# Patient Record
Sex: Male | Born: 2000 | Race: Black or African American | Hispanic: No | Marital: Single | Smoking: Never smoker
Health system: Southern US, Community
[De-identification: ages and names within clinical notes are randomized; demographics above are authoritative.]

## PROBLEM LIST (undated history)

## (undated) ENCOUNTER — Ambulatory Visit

## (undated) DIAGNOSIS — S42009A Fracture of unspecified part of unspecified clavicle, initial encounter for closed fracture: Secondary | ICD-10-CM

## (undated) HISTORY — DX: Fracture of unspecified part of unspecified clavicle, initial encounter for closed fracture: S42.009A

---
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
## 2021-01-01 LAB — HIV ANTIBODY (ROUTINE TESTING W REFLEX): HIV Screen 4th Generation wRfx: NONREACTIVE

## 2021-01-01 LAB — RPR: RPR Ser Ql: NONREACTIVE

## 2021-01-03 ENCOUNTER — Other Ambulatory Visit: Payer: Self-pay | Admitting: Family Medicine

## 2021-01-03 LAB — URINE CYTOLOGY ANCILLARY ONLY
Chlamydia: POSITIVE — AB
Comment: NEGATIVE
Comment: NORMAL
Neisseria Gonorrhea: NEGATIVE

## 2021-01-03 NOTE — Progress Notes (Signed)
I attempted to reach out to Rawlin multiple times today on his cell phone without success.  His number did not go to a voicemail.  As result, I called the other contact listed in his medical record and spoke with his mother who informed me that he is at work at the moment.  It is easier to contact him in the morning.  If he returns a call to the clinic.  Please let him know I wanted to talk about his recent test results as he does have an infection that we need to treat.

## 2021-01-04 ENCOUNTER — Telehealth: Payer: Self-pay

## 2021-01-04 ENCOUNTER — Other Ambulatory Visit: Payer: Self-pay | Admitting: Family Medicine

## 2021-01-04 MED ORDER — DOXYCYCLINE HYCLATE 100 MG PO TABS: 100.0000 mg | ORAL_TABLET | Freq: Two times a day (BID) | ORAL | 0 refills | Status: AC

## 2021-01-04 NOTE — Progress Notes (Unsigned)
Steven Spears was contacted and informed that his STI screening was remarkable for a positive chlamydia test.  He was informed that he should be treated here in clinic with antibiotics.  He was also told that he should abstain from intercourse until he has completed his treatment.  He was also encouraged to provide the names and birthdays of any partners in the past 2 months so that we could also have them treated.  He reported that he has had 1 partner in that timeframe whose first name is [name].  He does not have the birthday or last name of this partner.  -Doxycycline 100 mg 7 days twice daily sent to his pharmacy -He was encouraged to call back with his partners name, birthday and any known drug allergies so we could have e.p.t. sent in for her.

## 2021-01-04 NOTE — Telephone Encounter (Signed)
Patient calls nurse line returning phone call to provider. Per chart review, provider was calling to discuss results.   Patient states that he can be reached at [phone] between now and 3 p.m. to discuss results.   To PCP  Veronda Prude, RN

## 2021-09-07 ENCOUNTER — Other Ambulatory Visit: Payer: Self-pay

## 2021-09-07 ENCOUNTER — Ambulatory Visit
Admission: EM | Admit: 2021-09-07 | Discharge: 2021-09-07 | Disposition: A | Discharge status: Home / Self Care | Payer: Medicaid Other | Attending: Physician Assistant | Admitting: Physician Assistant

## 2021-09-07 ENCOUNTER — Encounter: Payer: Self-pay | Admitting: Emergency Medicine

## 2021-09-07 DIAGNOSIS — J069 Acute upper respiratory infection, unspecified: Secondary | ICD-10-CM | POA: Diagnosis not present

## 2021-09-07 MED ORDER — IBUPROFEN 400 MG PO TABS
400.0000 mg | ORAL_TABLET | Freq: Once | ORAL | Status: AC
  Administered 2021-09-07: 400 mg via ORAL

## 2021-09-07 NOTE — ED Provider Notes (Signed)
[facility]    CSN: [id number] Arrival date & time: 09/07/21  0936      History   Chief Complaint Chief Complaint  Patient presents with   Cough    HPI Steven Spears is a 20 y.o. male.   Patient here today with his mother for evaluation of cough, fever, body aches that started 2 to 3 days ago.  He has not had any vomiting or diarrhea.  He does report some sore throat with cough.  He has tried over-the-counter medication with mild relief of symptoms.  The history is provided by the patient and a parent.  Cough Associated symptoms: chills, fever and sore throat   Associated symptoms: no ear pain, no eye discharge and no shortness of breath    Past Medical History:  Diagnosis Date   Clavicle fracture    20 years old    Patient Active Problem List   Diagnosis Date Noted   Symptoms of depression 08/06/2018   Right knee injury 05/17/2016   Concussion with no loss of consciousness 07/12/2015   Atopic dermatitis 06/05/2014   Seasonal allergies 02/07/2012   Dog bite of limb 05/23/2011   Routine screening for STI (sexually transmitted infection) 12/19/2010    History reviewed. No pertinent surgical history.     Home Medications    Prior to Admission medications   Medication Sig Start Date End Date Taking? Authorizing Provider  acetaminophen (TYLENOL) 325 MG tablet Take 2 tablets (650 mg total) by mouth every 4 (four) hours as needed for headache. 07/05/15   Lowanda Foster, NP  hydrocortisone 2.5 % cream Apply topically daily as needed. Avoid use on face. 06/05/14   Smitty Cords, DO    Family History Family History  Problem Relation Age of Onset   Asthma Mother    Asthma Father    Heart disease Maternal Grandmother     Social History Social History   Tobacco Use   Smoking status: Never   Smokeless tobacco: Never  Substance Use Topics   Alcohol use: No   Drug use: No     Allergies   Patient has no known allergies.   Review of  Systems Review of Systems  Constitutional:  Positive for chills and fever.  HENT:  Positive for congestion and sore throat. Negative for ear pain.   Eyes:  Negative for discharge and redness.  Respiratory:  Positive for cough. Negative for shortness of breath.   Gastrointestinal:  Negative for abdominal pain, diarrhea, nausea and vomiting.    Physical Exam Triage Vital Signs ED Triage Vitals [09/07/21 0956]  Enc Vitals Group     BP 109/68     Pulse Rate 95     Resp 16     Temp (!) 101.3 F (38.5 C)     Temp Source Oral     SpO2 97 %     Weight      Height      Head Circumference      Peak Flow      Pain Score 0     Pain Loc      Pain Edu?      Excl. in GC?    No data found.  Updated Vital Signs BP 109/68 (BP Location: Right Arm)   Pulse 95   Temp (!) 101.3 F (38.5 C) (Oral)   Resp 16   SpO2 97%      Physical Exam Vitals and nursing note reviewed.  Constitutional:  General: He is not in acute distress.    Appearance: Normal appearance. He is not ill-appearing.  HENT:     Head: Normocephalic and atraumatic.     Nose: Congestion present.     Mouth/Throat:     Mouth: Mucous membranes are moist.     Pharynx: Oropharynx is clear. No oropharyngeal exudate or posterior oropharyngeal erythema.  Eyes:     Conjunctiva/sclera: Conjunctivae normal.  Cardiovascular:     Rate and Rhythm: Normal rate and regular rhythm.     Heart sounds: Normal heart sounds. No murmur heard. Pulmonary:     Effort: Pulmonary effort is normal. No respiratory distress.     Breath sounds: Normal breath sounds. No wheezing, rhonchi or rales.  Skin:    General: Skin is warm and dry.  Neurological:     Mental Status: He is alert.  Psychiatric:        Mood and Affect: Mood normal.        Thought Content: Thought content normal.     UC Treatments / Results  Labs (all labs ordered are listed, but only abnormal results are displayed) Labs Reviewed  COVID-19, FLU A+B NAA     EKG   Radiology No results found.  Procedures Procedures (including critical care time)  Medications Ordered in UC Medications  ibuprofen (ADVIL) tablet 400 mg (400 mg Oral Given 09/07/21 1007)    Initial Impression / Assessment and Plan / UC Course  I have reviewed the triage vital signs and the nursing notes.  Pertinent labs & imaging results that were available during my care of the patient were reviewed by me and considered in my medical decision making (see chart for details).    Suspect likely viral etiology of symptoms.  Will order flu and COVID screening.  Recommend symptomatic treatment, increase fluids and rest in the meantime.  Encouraged follow-up with any further concerns.  Final Clinical Impressions(s) / UC Diagnoses   Final diagnoses:  Acute upper respiratory infection   Discharge Instructions   None    ED Prescriptions   None    PDMP not reviewed this encounter.   Tomi Bamberger, PA-C 09/07/21 1112

## 2021-09-07 NOTE — ED Triage Notes (Signed)
Cough headache body aches fever starting 2-3 days ago. Took two tylenol prior to arrival today. Denies nausea, vomiting, diarrhea

## 2021-09-08 LAB — COVID-19, FLU A+B NAA
Influenza A, NAA: DETECTED — AB
Influenza B, NAA: NOT DETECTED
SARS-CoV-2, NAA: NOT DETECTED

## 2022-02-11 DIAGNOSIS — J209 Acute bronchitis, unspecified: Secondary | ICD-10-CM | POA: Diagnosis not present

## 2022-02-11 DIAGNOSIS — R062 Wheezing: Secondary | ICD-10-CM | POA: Diagnosis not present

## 2022-02-11 DIAGNOSIS — J029 Acute pharyngitis, unspecified: Secondary | ICD-10-CM | POA: Diagnosis not present

## 2022-02-11 DIAGNOSIS — Z9109 Other allergy status, other than to drugs and biological substances: Secondary | ICD-10-CM | POA: Diagnosis not present

## 2022-02-11 DIAGNOSIS — R051 Acute cough: Secondary | ICD-10-CM | POA: Diagnosis not present

## 2022-03-14 ENCOUNTER — Encounter: Payer: Self-pay | Admitting: *Deleted

## 2022-04-17 ENCOUNTER — Ambulatory Visit
Admission: EM | Admit: 2022-04-17 | Discharge: 2022-04-17 | Disposition: A | Discharge status: Home / Self Care | Payer: Medicaid Other

## 2022-04-17 DIAGNOSIS — S76312A Strain of muscle, fascia and tendon of the posterior muscle group at thigh level, left thigh, initial encounter: Secondary | ICD-10-CM

## 2022-04-17 NOTE — ED Provider Notes (Signed)
[facility]    CSN: [id number] Arrival date & time: 04/17/22  1918    HISTORY   Chief Complaint  Patient presents with   Leg Pain   HPI Steven Spears is a 21 y.o. male. Patient presents to urgent care complaining of tightness and pain on the posterior lateral aspect of his left thigh.  Patient states he has been working out more thinks he may have strained a muscle.  Patient states the pain occurred 2 to 3 days ago.  Patient is requesting a note for work.  Patient states he has not tried any medication for his pain as of yet.  Patient ambulated independently into the clinic today.  Patient denies traumatic or previous injury to the area.  The history is provided by the patient.   Past Medical History:  Diagnosis Date   Clavicle fracture    21 years old   Patient Active Problem List   Diagnosis Date Noted   Symptoms of depression 08/06/2018   Right knee injury 05/17/2016   Concussion with no loss of consciousness 07/12/2015   Atopic dermatitis 06/05/2014   Seasonal allergies 02/07/2012   Dog bite of limb 05/23/2011   Routine screening for STI (sexually transmitted infection) 12/19/2010   History reviewed. No pertinent surgical history.  Home Medications    Prior to Admission medications   Medication Sig Start Date End Date Taking? Authorizing Provider  acetaminophen (TYLENOL) 325 MG tablet Take 2 tablets (650 mg total) by mouth every 4 (four) hours as needed for headache. 07/05/15   Lowanda Foster, NP  hydrocortisone 2.5 % cream Apply topically daily as needed. Avoid use on face. 06/05/14   Smitty Cords, DO    Family History Family History  Problem Relation Age of Onset   Asthma Mother    Asthma Father    Heart disease Maternal Grandmother    Social History Social History   Tobacco Use   Smoking status: Never   Smokeless tobacco: Never  Substance Use Topics   Alcohol use: No   Drug use: No   Allergies   Patient has no known  allergies.  Review of Systems Review of Systems Pertinent findings noted in history of present illness.   Physical Exam Triage Vital Signs ED Triage Vitals  Enc Vitals Group     BP 08/05/21 0827 (!) 147/82     Pulse Rate 08/05/21 0827 72     Resp 08/05/21 0827 18     Temp 08/05/21 0827 98.3 F (36.8 C)     Temp Source 08/05/21 0827 Oral     SpO2 08/05/21 0827 98 %     Weight --      Height --      Head Circumference --      Peak Flow --      Pain Score 08/05/21 0826 5     Pain Loc --      Pain Edu? --      Excl. in GC? --   No data found.  Updated Vital Signs BP 123/79 (BP Location: Left Arm)   Pulse (!) 56   Temp 98.1 F (36.7 C) (Oral)   Resp 16   SpO2 100%   Physical Exam Vitals and nursing note reviewed.  Constitutional:      General: He is not in acute distress.    Appearance: Normal appearance. He is normal weight. He is not ill-appearing.  HENT:     Head: Normocephalic and atraumatic.  Eyes:  Extraocular Movements: Extraocular movements intact.     Conjunctiva/sclera: Conjunctivae normal.     Pupils: Pupils are equal, round, and reactive to light.  Cardiovascular:     Rate and Rhythm: Normal rate and regular rhythm.  Pulmonary:     Effort: Pulmonary effort is normal.     Breath sounds: Normal breath sounds.  Musculoskeletal:        General: Tenderness present. No swelling, deformity or signs of injury. Normal range of motion.     Cervical back: Normal range of motion and neck supple.     Right lower leg: No edema.     Left lower leg: No edema.  Skin:    General: Skin is warm and dry.  Neurological:     General: No focal deficit present.     Mental Status: He is alert and oriented to person, place, and time. Mental status is at baseline.  Psychiatric:        Mood and Affect: Mood normal.        Behavior: Behavior normal.        Thought Content: Thought content normal.        Judgment: Judgment normal.     Visual Acuity Right Eye  Distance:   Left Eye Distance:   Bilateral Distance:    Right Eye Near:   Left Eye Near:    Bilateral Near:     UC Couse / Diagnostics / Procedures:    EKG  Radiology No results found.  Procedures Procedures (including critical care time)  UC Diagnoses / Final Clinical Impressions(s)   I have reviewed the triage vital signs and the nursing notes.  Pertinent labs & imaging results that were available during my care of the patient were reviewed by me and considered in my medical decision making (see chart for details).    Final diagnoses:  Hamstring strain, left, initial encounter   Patient advised ibuprofen and topical pain relief as needed.  Note provided for work.  Return precautions advised.  ED Prescriptions   None    PDMP not reviewed this encounter.  Pending results:  Labs Reviewed - No data to display  Medications Ordered in UC: Medications - No data to display  Disposition Upon Discharge:  Condition: stable for discharge home Home: take medications as prescribed; routine discharge instructions as discussed; follow up as advised.  Patient presented with an acute illness with associated systemic symptoms and significant discomfort requiring urgent management. In my opinion, this is a condition that a prudent lay person (someone who possesses an average knowledge of health and medicine) may potentially expect to result in complications if not addressed urgently such as respiratory distress, impairment of bodily function or dysfunction of bodily organs.   Routine symptom specific, illness specific and/or disease specific instructions were discussed with the patient and/or caregiver at length.   As such, the patient has been evaluated and assessed, work-up was performed and treatment was provided in alignment with urgent care protocols and evidence based medicine.  Patient/parent/caregiver has been advised that the patient may require follow up for further testing  and treatment if the symptoms continue in spite of treatment, as clinically indicated and appropriate.  Patient/parent/caregiver has been advised to return to the [facility] or PCP if no better; to PCP or the Emergency Department if new signs and symptoms develop, or if the current signs or symptoms continue to change or worsen for further workup, evaluation and treatment as clinically indicated and appropriate  The patient will  follow up with their current PCP if and as advised. If the patient does not currently have a PCP we will assist them in obtaining one.   The patient may need specialty follow up if the symptoms continue, in spite of conservative treatment and management, for further workup, evaluation, consultation and treatment as clinically indicated and appropriate.   Patient/parent/caregiver verbalized understanding and agreement of plan as discussed.  All questions were addressed during visit.  Please see discharge instructions below for further details of plan.  Discharge Instructions:   Discharge Instructions      Please feel free to apply ice, Voltaren gel, Aspercreme to affected area.  You can also take ibuprofen 400 to 600 mg 3 times daily as needed for pain.  Please avoid attempts to stretch or strengthen the area until the pain is resolved.  I provided you with a note to return to work.  Thank you for visiting urgent care.    This office note has been dictated using Teaching laboratory technician.  Unfortunately, and despite my best efforts, this method of dictation can sometimes lead to occasional typographical or grammatical errors.  I apologize in advance if this occurs.     Theadora Rama Scales, [place] 04/17/22 [phone]

## 2022-04-17 NOTE — Discharge Instructions (Addendum)
Please feel free to apply ice, Voltaren gel, Aspercreme to affected area.  You can also take ibuprofen 400 to 600 mg 3 times daily as needed for pain.  Please avoid attempts to stretch or strengthen the area until the pain is resolved.  I provided you with a note to return to work.  Thank you for visiting urgent care.

## 2022-04-17 NOTE — ED Triage Notes (Signed)
Pt c/o tightness in his left thigh, he states he has been working out more.  Started: 2-3 days ago  Home interventions: none

## 2022-04-26 ENCOUNTER — Ambulatory Visit
Admission: EM | Admit: 2022-04-26 | Discharge: 2022-04-26 | Disposition: A | Discharge status: Home / Self Care | Payer: Medicaid Other

## 2022-04-26 DIAGNOSIS — M79605 Pain in left leg: Secondary | ICD-10-CM | POA: Diagnosis not present

## 2022-04-26 DIAGNOSIS — R1033 Periumbilical pain: Secondary | ICD-10-CM

## 2022-04-26 NOTE — ED Provider Notes (Signed)
[facility]    CSN: [id number] Arrival date & time: 04/26/22  1512      History   Chief Complaint Chief Complaint  Patient presents with   Leg Pain/Work Note    HPI Steven Spears is a 21 y.o. male.   Patient presenting today to receive a work note.  Patient was seen on 04/17/2022 for suspicion of left hamstring muscle strain.  Patient reports that that pain has been intermittent but is now having pain in the anterior thigh that radiates down to his knee at times.  Denies any other obvious injury.  Patient does a lot of prolonged standing and strenuous work at his place of employment so he is thinking that he has aggravating symptoms.  He has been using ice application as needed but has not taken any medications for pain.  Patient also reports that he has periumbilical pain at times that typically only occurs when he "stretches his abdominal muscles out" or works out.  Denies nausea, vomiting, diarrhea, constipation.  Patient having normal bowel movements and is urinating normally.  He reports it is not very bothersome but he was just curious what the cause could be.     Past Medical History:  Diagnosis Date   Clavicle fracture    21 years old    Patient Active Problem List   Diagnosis Date Noted   Symptoms of depression 08/06/2018   Right knee injury 05/17/2016   Concussion with no loss of consciousness 07/12/2015   Atopic dermatitis 06/05/2014   Seasonal allergies 02/07/2012   Dog bite of limb 05/23/2011   Routine screening for STI (sexually transmitted infection) 12/19/2010    History reviewed. No pertinent surgical history.     Home Medications    Prior to Admission medications   Medication Sig Start Date End Date Taking? Authorizing Provider  acetaminophen (TYLENOL) 325 MG tablet Take 2 tablets (650 mg total) by mouth every 4 (four) hours as needed for headache. 07/05/15   Lowanda Foster, NP  hydrocortisone 2.5 % cream Apply topically daily as needed.  Avoid use on face. 06/05/14   Smitty Cords, DO    Family History Family History  Problem Relation Age of Onset   Asthma Mother    Asthma Father    Heart disease Maternal Grandmother     Social History Social History   Tobacco Use   Smoking status: Never   Smokeless tobacco: Never  Substance Use Topics   Alcohol use: No   Drug use: No     Allergies   Patient has no known allergies.   Review of Systems Review of Systems Per HPI  Physical Exam Triage Vital Signs ED Triage Vitals  Enc Vitals Group     BP 04/26/22 1529 118/75     Pulse Rate 04/26/22 1529 93     Resp 04/26/22 1529 17     Temp 04/26/22 1529 98 F (36.7 C)     Temp Source 04/26/22 1529 Oral     SpO2 04/26/22 1529 98 %     Weight --      Height --      Head Circumference --      Peak Flow --      Pain Score 04/26/22 1528 5     Pain Loc --      Pain Edu? --      Excl. in GC? --    No data found.  Updated Vital Signs BP 118/75 (BP Location: Left Arm)  Pulse 93   Temp 98 F (36.7 C) (Oral)   Resp 17   SpO2 98%   Visual Acuity Right Eye Distance:   Left Eye Distance:   Bilateral Distance:    Right Eye Near:   Left Eye Near:    Bilateral Near:     Physical Exam Constitutional:      General: He is not in acute distress.    Appearance: Normal appearance. He is not toxic-appearing or diaphoretic.  HENT:     Head: Normocephalic and atraumatic.  Eyes:     Extraocular Movements: Extraocular movements intact.     Conjunctiva/sclera: Conjunctivae normal.  Cardiovascular:     Rate and Rhythm: Normal rate and regular rhythm.     Pulses: Normal pulses.     Heart sounds: Normal heart sounds.  Pulmonary:     Effort: Pulmonary effort is normal.     Breath sounds: Normal breath sounds.  Abdominal:     General: Bowel sounds are normal. There is no distension.     Palpations: Abdomen is soft.     Tenderness: There is no abdominal tenderness.     Hernia: No hernia is present.   Musculoskeletal:     Left knee: No LCL laxity, MCL laxity, ACL laxity or PCL laxity.Normal pulse.     Comments: Tenderness to palpation to mid left posterior thigh that extends to posterior knee.  Mild tenderness to palpation to anterior thigh directly above knee and medial left upper knee.  No obvious swelling, discoloration, lacerations, abrasions noted.  Patient can bear weight.  Neurovascular intact.  Neurological:     General: No focal deficit present.     Mental Status: He is alert and oriented to person, place, and time. Mental status is at baseline.  Psychiatric:        Mood and Affect: Mood normal.        Behavior: Behavior normal.        Thought Content: Thought content normal.        Judgment: Judgment normal.      UC Treatments / Results  Labs (all labs ordered are listed, but only abnormal results are displayed) Labs Reviewed - No data to display  EKG   Radiology No results found.  Procedures Procedures (including critical care time)  Medications Ordered in UC Medications - No data to display  Initial Impression / Assessment and Plan / UC Course  I have reviewed the triage vital signs and the nursing notes.  Pertinent labs & imaging results that were available during my care of the patient were reviewed by me and considered in my medical decision making (see chart for details).     Patient having persistent left hamstring strain and muscle pain.  Suspect anterior thigh/knee pain is muscular in nature as well.  Advised supportive care, ice application, NSAIDs as needed for pain.  Unsure exact etiology of patient's periumbilical pain.  Differential diagnoses include muscular strain versus umbilical hernia given that patient works out on a regular basis.  Advised patient to follow-up with general surgery at provided contact information to have ultrasound completed to rule out hernia.  No signs of strangulation and have low suspicion for hernia.  Patient was given  supportive care and strict return and ER precautions for all chief complaints today.  Patient verbalized understanding and was agreeable with plan. Final Clinical Impressions(s) / UC Diagnoses   Final diagnoses:  Left leg pain  Periumbilical pain     Discharge Instructions  Continue ice application and ibuprofen as needed for pain.  The pain around your bellybutton could be muscular in nature but it is also possible that you could have a hernia given that you workout and lift weights.  Recommend that you follow-up with general surgery at provided contact information to have ultrasound completed.    ED Prescriptions   None    PDMP not reviewed this encounter.   Teodora Medici, [place] 04/26/22 1627

## 2022-04-26 NOTE — Discharge Instructions (Signed)
Continue ice application and ibuprofen as needed for pain.  The pain around your bellybutton could be muscular in nature but it is also possible that you could have a hernia given that you workout and lift weights.  Recommend that you follow-up with general surgery at provided contact information to have ultrasound completed.

## 2022-04-26 NOTE — ED Triage Notes (Signed)
Pt presents with ongoing left thigh pain and would like a work note.

## 2022-06-06 DIAGNOSIS — A059 Bacterial foodborne intoxication, unspecified: Secondary | ICD-10-CM | POA: Diagnosis not present

## 2022-12-24 DIAGNOSIS — S8001XA Contusion of right knee, initial encounter: Secondary | ICD-10-CM | POA: Diagnosis not present

## 2023-07-14 DIAGNOSIS — S93491A Sprain of other ligament of right ankle, initial encounter: Secondary | ICD-10-CM | POA: Diagnosis not present

## 2023-07-14 DIAGNOSIS — S93691A Other sprain of right foot, initial encounter: Secondary | ICD-10-CM | POA: Diagnosis not present

## 2023-07-17 DIAGNOSIS — S93401D Sprain of unspecified ligament of right ankle, subsequent encounter: Secondary | ICD-10-CM | POA: Diagnosis not present

## 2024-06-09 ENCOUNTER — Encounter: Payer: Self-pay | Admitting: *Deleted

## 2024-06-09 ENCOUNTER — Other Ambulatory Visit: Payer: Self-pay

## 2024-06-09 ENCOUNTER — Ambulatory Visit: Admission: EM | Admit: 2024-06-09 | Discharge: 2024-06-09 | Disposition: A | Discharge status: Home / Self Care

## 2024-06-09 DIAGNOSIS — R0781 Pleurodynia: Secondary | ICD-10-CM | POA: Diagnosis not present

## 2024-06-09 NOTE — ED Triage Notes (Signed)
 Pt states he hit right ribs on metal rail at work 2 days ago. C/o pain when he lies on that side or touches it. Needs work note for today. He has tried ice to manage the pain

## 2024-06-09 NOTE — ED Provider Notes (Signed)
 [facility]    CSN: [id number] Arrival date & time: 06/09/24  1136      History   Chief Complaint Chief Complaint  Patient presents with   Rib Injury    HPI Steven Spears is a 23 y.o. male.   Patient here today for evaluation of tenderness to his right lateral ribs that started after he accidentally hit them on a metal rail at work 2 days ago.  He reports that he does not have any pain with deep breaths or movement only if he is touching the area or lying on it.  He states ultimately he needs a work note for today.  He has tried ice with some relief.  The history is provided by the patient.    Past Medical History:  Diagnosis Date   Clavicle fracture    23 years old    Patient Active Problem List   Diagnosis Date Noted   Symptoms of depression 08/06/2018   Right knee injury 05/17/2016   Concussion with no loss of consciousness 07/12/2015   Atopic dermatitis 06/05/2014   Seasonal allergies 02/07/2012   Dog bite of limb 05/23/2011   Routine screening for STI (sexually transmitted infection) 12/19/2010    History reviewed. No pertinent surgical history.     Home Medications    Prior to Admission medications   Medication Sig Start Date End Date Taking? Authorizing Provider  acetaminophen  (TYLENOL ) 325 MG tablet Take 2 tablets (650 mg total) by mouth every 4 (four) hours as needed for headache. 07/05/15   Eilleen Colander, NP  hydrocortisone  2.5 % cream Apply topically daily as needed. Avoid use on face. 06/05/14   Edman Marsa PARAS, DO    Family History Family History  Problem Relation Age of Onset   Asthma Mother    Asthma Father    Heart disease Maternal Grandmother     Social History Social History   Tobacco Use   Smoking status: Never   Smokeless tobacco: Never  Vaping Use   Vaping status: Former  Substance Use Topics   Alcohol use: No   Drug use: No     Allergies   Patient has no known allergies.   Review of Systems Review  of Systems  Constitutional:  Negative for chills and fever.  Eyes:  Negative for discharge and redness.  Respiratory:  Negative for shortness of breath.   Neurological:  Negative for numbness.     Physical Exam Triage Vital Signs ED Triage Vitals  Encounter Vitals Group     BP 06/09/24 1156 127/84     Girls Systolic BP Percentile --      Girls Diastolic BP Percentile --      Boys Systolic BP Percentile --      Boys Diastolic BP Percentile --      Pulse Rate 06/09/24 1156 70     Resp 06/09/24 1156 16     Temp 06/09/24 1156 98.9 F (37.2 C)     Temp Source 06/09/24 1156 Oral     SpO2 06/09/24 1156 98 %     Weight --      Height --      Head Circumference --      Peak Flow --      Pain Score 06/09/24 1153 7     Pain Loc --      Pain Education --      Exclude from Growth Chart --    No data found.  Updated Vital Signs BP  127/84 (BP Location: Left Arm)   Pulse 70   Temp 98.9 F (37.2 C) (Oral)   Resp 16   SpO2 98%   Visual Acuity Right Eye Distance:   Left Eye Distance:   Bilateral Distance:    Right Eye Near:   Left Eye Near:    Bilateral Near:     Physical Exam Vitals and nursing note reviewed.  Constitutional:      General: He is not in acute distress.    Appearance: Normal appearance. He is not ill-appearing.  HENT:     Head: Normocephalic and atraumatic.  Eyes:     Conjunctiva/sclera: Conjunctivae normal.  Cardiovascular:     Rate and Rhythm: Normal rate and regular rhythm.  Pulmonary:     Effort: Pulmonary effort is normal. No respiratory distress.     Breath sounds: No wheezing, rhonchi or rales.  Chest:     Chest wall: Tenderness (right lateral lower ribs) present.  Neurological:     Mental Status: He is alert.  Psychiatric:        Mood and Affect: Mood normal.        Behavior: Behavior normal.        Thought Content: Thought content normal.      UC Treatments / Results  Labs (all labs ordered are listed, but only abnormal results are  displayed) Labs Reviewed - No data to display  EKG   Radiology No results found.  Procedures Procedures (including critical care time)  Medications Ordered in UC Medications - No data to display  Initial Impression / Assessment and Plan / UC Course  I have reviewed the triage vital signs and the nursing notes.  Pertinent labs & imaging results that were available during my care of the patient were reviewed by me and considered in my medical decision making (see chart for details).    Patient with mild tenderness on exam.  Work note provided as requested advised ibuprofen  if needed for pain.  Encouraged follow-up with any further concerns.  Encouraged purposeful deep breathing to prevent pneumonia.  Advised follow-up if no gradual improvement.  Final Clinical Impressions(s) / UC Diagnoses   Final diagnoses:  Rib pain on right side   Discharge Instructions   None    ED Prescriptions   None    PDMP not reviewed this encounter.   Billy Asberry FALCON, PA-C 06/09/24 1729

## 2024-06-16 DIAGNOSIS — R519 Headache, unspecified: Secondary | ICD-10-CM | POA: Diagnosis not present

## 2024-06-16 DIAGNOSIS — R12 Heartburn: Secondary | ICD-10-CM | POA: Diagnosis not present

## 2024-06-17 ENCOUNTER — Ambulatory Visit
